# Patient Record
Sex: Male | Born: 2000 | Hispanic: Yes | Marital: Single | Smoking: Current every day smoker
Health system: Southern US, Community
[De-identification: ages and names within clinical notes are randomized; demographics above are authoritative.]

## PROBLEM LIST (undated history)

## (undated) HISTORY — PX: NO PAST SURGERIES: SHX2092

---
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
## 2019-03-09 ENCOUNTER — Emergency Department
Admission: EM | Admit: 2019-03-09 | Discharge: 2019-03-10 | Disposition: A | Discharge status: Home / Self Care | Payer: Self-pay | Attending: Emergency Medicine | Admitting: Emergency Medicine

## 2019-03-09 ENCOUNTER — Emergency Department: Payer: Self-pay

## 2019-03-09 ENCOUNTER — Other Ambulatory Visit: Payer: Self-pay

## 2019-03-09 ENCOUNTER — Encounter: Payer: Self-pay | Admitting: Emergency Medicine

## 2019-03-09 DIAGNOSIS — F1210 Cannabis abuse, uncomplicated: Secondary | ICD-10-CM | POA: Insufficient documentation

## 2019-03-09 DIAGNOSIS — I861 Scrotal varices: Secondary | ICD-10-CM | POA: Insufficient documentation

## 2019-03-09 DIAGNOSIS — N50812 Left testicular pain: Secondary | ICD-10-CM

## 2019-03-09 LAB — URINALYSIS, COMPLETE (UACMP) WITH MICROSCOPIC
Bacteria, UA: NONE SEEN
Bilirubin Urine: NEGATIVE
Glucose, UA: NEGATIVE mg/dL
Hgb urine dipstick: NEGATIVE
Ketones, ur: NEGATIVE mg/dL
Leukocytes,Ua: NEGATIVE
Nitrite: NEGATIVE
Protein, ur: NEGATIVE mg/dL
Specific Gravity, Urine: 1.024 (ref 1.005–1.030)
Squamous Epithelial / LPF: NONE SEEN (ref 0–5)
pH: 6 (ref 5.0–8.0)

## 2019-03-09 MED ORDER — IBUPROFEN 400 MG PO TABS
400.0000 mg | ORAL_TABLET | Freq: Once | ORAL | Status: AC
  Administered 2019-03-09: 400 mg via ORAL
  Filled 2019-03-09: qty 1

## 2019-03-09 NOTE — ED Triage Notes (Signed)
PT c/o LFT testicular pain since yesterday, worsening pain today, Denies any acute injury or swelling. NAD noted

## 2019-03-09 NOTE — ED Notes (Signed)
ED Provider at bedside. 

## 2019-03-09 NOTE — ED Provider Notes (Signed)
[name] Emergency Department Provider Note    First MD Initiated Contact with Patient 03/09/19 2327     (approximate)  I have reviewed the triage vital signs and the nursing notes.   HISTORY  Chief Complaint No chief complaint on file.    HPI Phillip Cole is a 18 y.o. male with medical history as listed below presents to the emergency department with acute onset of left testicular pain today.  Patient states that he lifted multiple heavy objects today at work.  Patient denies any abdominal discomfort.  Patient denies any dysuria no penile discharge.  Patient denies any fever.  Patient denies any nausea or vomiting.       History reviewed. No pertinent past medical history.  There are no active problems to display for this patient.   History reviewed. No pertinent surgical history.  Prior to Admission medications   Medication Sig Start Date End Date Taking? Authorizing Provider  meloxicam (MOBIC) 7.5 MG tablet Take 1 tablet (7.5 mg total) by mouth daily. For about one week and then as needed 09/10/15   Hassan Rowan, MD    Allergies Patient has no known allergies.  No family history on file.  Social History Social History   Tobacco Use  . Smoking status: Never Smoker  . Smokeless tobacco: Never Used  Substance Use Topics  . Alcohol use: No  . Drug use: Yes    Types: Marijuana    Review of Systems Constitutional: No fever/chills Eyes: No visual changes. ENT: No sore throat. Cardiovascular: Denies chest pain. Respiratory: Denies shortness of breath. Gastrointestinal: No abdominal pain.  No nausea, no vomiting.  No diarrhea.  No constipation. Genitourinary: Negative for dysuria.  Positive for left testicular pain Musculoskeletal: Negative for neck pain.  Negative for back pain. Integumentary: Negative for rash. Neurological: Negative for headaches, focal weakness or numbness.   ____________________________________________   PHYSICAL EXAM:  VITAL SIGNS: ED Triage Vitals [03/09/19 2226]  Enc Vitals Group     BP 140/74     Pulse Rate 70     Resp 16     Temp (!) 97.4 F (36.3 C)     Temp Source Oral     SpO2 100 %     Weight 64.4 kg (142 lb)     Height 1.778 m (5\' 10" )     Head Circumference      Peak Flow      Pain Score 9     Pain Loc      Pain Edu?      Excl. in GC?     Constitutional: Alert and oriented. Well appearing and in no acute distress. Eyes: Conjunctivae are normal. PERRL. EOMI. Mouth/Throat: Mucous membranes are moist.  Oropharynx non-erythematous. Neck: No stridor.   Cardiovascular: Normal rate, regular rhythm. Good peripheral circulation. Grossly normal heart sounds. Respiratory: Normal respiratory effort.  No retractions. Lungs CTAB. Gastrointestinal: Soft and nontender. No distention.  Genitourinary: Left testicular tenderness to palpation. Musculoskeletal: No lower extremity tenderness nor edema. No gross deformities of extremities. Neurologic:  Normal speech and language. No gross focal neurologic deficits are appreciated.  Skin:  Skin is warm, dry and intact. No rash noted. Psychiatric: Mood and affect are normal. Speech and behavior are normal.  ____________________________________________   LABS (all labs ordered are listed, but only abnormal results are displayed)  Labs Reviewed  URINALYSIS, COMPLETE (UACMP) WITH MICROSCOPIC - Abnormal; Notable for the following components:      Result Value  Color, Urine YELLOW (*)    APPearance CLEAR (*)    All other components within normal limits      Procedures   ____________________________________________   INITIAL IMPRESSION / MDM / ASSESSMENT AND PLAN / ED COURSE  As part of my medical decision making, I reviewed the following data within the electronic MEDICAL RECORD NUMBER   18 year old male presenting with above-stated history and physical exam secondary to left  testicular pain.  Scrotal ultrasound revealed varicocele.  Patient denied any dysuria no penile discharge however given known history of STDs in the past urine chlamydia gonorrhea was obtained however patient left emergency department before results were obtained.  I followed up on the patient's Chlamydia gonorrhea which revealed positive chlamydia.  I attempted to call the patient however he did not answer the phone.  I will leave note for  Phillip Cole to follow-up with the patient today.  ____________________________________________  FINAL CLINICAL IMPRESSION(S) / ED DIAGNOSES  Final diagnoses:  Varicocele     MEDICATIONS GIVEN DURING THIS VISIT:  Medications - No data to display   ED Discharge Orders    None       Note:  This document was prepared using Dragon voice recognition software and may include unintentional dictation errors.   [name], MD 03/10/19 [phone]

## 2019-03-09 NOTE — ED Notes (Signed)
Patient transported to Ultrasound 

## 2019-03-09 NOTE — ED Notes (Addendum)
Patient c/o left testicular pain beginning yesterday with worsening severity today. Patient denies swelling.   Patient reports recently being treated for chlamydia (2/19). Patient reports being treated for gonorrhea and chlamydia prior. Patient sexual intercourse since completing his antibiotics - no condom/protection was used.

## 2019-03-10 ENCOUNTER — Telehealth: Payer: Self-pay | Admitting: Emergency Medicine

## 2019-03-10 LAB — CHLAMYDIA/NGC RT PCR (ARMC ONLY)
Chlamydia Tr: DETECTED — AB
N gonorrhoeae: NOT DETECTED

## 2019-03-10 NOTE — ED Notes (Signed)
Reviewed discharge instructions, follow-up care, and OTC pain relievers with patient. Patient verbalized understanding of all information reviewed. Patient stable, with no distress noted at this time.    

## 2019-03-10 NOTE — Telephone Encounter (Signed)
Called patient due to positive chlamydia test.   Per dr Lenard Lance, call in azithromycin 1 gram po.  He was treated for same about 2 weeks ago--[facility]  I called [facility] and spoke with his provider there.   She agreed that we should treat again.  I called the patient to inform him.    I told him that his provider at clinic wanted him to be treated again for the chlamydia and he needs to call if he still has any symptoms on Monday. He says he has had unprotected sex since being treated.  I told him that she should also consult with health department to see if she needs to be treated. (this is a different partner than previous.)

## 2019-03-23 ENCOUNTER — Ambulatory Visit
Admission: EM | Admit: 2019-03-23 | Discharge: 2019-03-23 | Disposition: A | Discharge status: Home / Self Care | Payer: Self-pay | Attending: Family Medicine | Admitting: Family Medicine

## 2019-03-23 ENCOUNTER — Other Ambulatory Visit: Payer: Self-pay

## 2019-03-23 DIAGNOSIS — J029 Acute pharyngitis, unspecified: Secondary | ICD-10-CM

## 2019-03-23 DIAGNOSIS — R509 Fever, unspecified: Secondary | ICD-10-CM

## 2019-03-23 DIAGNOSIS — R05 Cough: Secondary | ICD-10-CM

## 2019-03-23 LAB — RAPID STREP SCREEN (MED CTR MEBANE ONLY): Streptococcus, Group A Screen (Direct): NEGATIVE

## 2019-03-23 MED ORDER — NAPROXEN 500 MG PO TABS: 500.0000 mg | ORAL_TABLET | Freq: Two times a day (BID) | ORAL | 0 refills | Status: AC | PRN

## 2019-03-23 MED ORDER — IPRATROPIUM BROMIDE 0.06 % NA SOLN: 2.0000 | Freq: Four times a day (QID) | NASAL | 0 refills | Status: AC | PRN

## 2019-03-23 NOTE — ED Provider Notes (Signed)
[facility]    CSN: [id number] Arrival date & time: 03/23/19  1024  History   Chief Complaint Chief Complaint  Patient presents with  . Cough   HPI  18 year old male presents with multiple complaints.  Reports that he has not been feeling well since last Thursday.  Patient reports subjective fever.  No documented fever.  He reports body aches.  He states that he has had a slight cough.  He also reports sore throat.  He currently has no fever and no body aches.  He states that he coughed briefly this morning.  His primary complaint is sore throat.  He also states that he has had blood when blowing his nose.  He states that he is taken over-the-counter medication without resolution.  No known exacerbating factors.  No other associated symptoms.  No other complaints.  History reviewed and updated as below.  PMH: Hx of STD  Past Surgical History:  Procedure Laterality Date  . NO PAST SURGERIES     Home Medications    Prior to Admission medications   Medication Sig Start Date End Date Taking? Authorizing Provider  ipratropium (ATROVENT) 0.06 % nasal spray Place 2 sprays into both nostrils 4 (four) times daily as needed for rhinitis. 03/23/19   Tommie Sams, DO  naproxen (NAPROSYN) 500 MG tablet Take 1 tablet (500 mg total) by mouth 2 (two) times daily as needed for moderate pain. 03/23/19   Tommie Sams, DO   Social History Social History   Tobacco Use  . Smoking status: Never Smoker  . Smokeless tobacco: Never Used  Substance Use Topics  . Alcohol use: No  . Drug use: Yes    Types: Marijuana     Allergies   Patient has no known allergies.   Review of Systems Review of Systems  Constitutional: Positive for fever.  HENT: Positive for sore throat.   Respiratory: Positive for cough.   Musculoskeletal:       Body aches.   Physical Exam Triage Vital Signs ED Triage Vitals  Enc Vitals Group     BP 03/23/19 1043 109/70     Pulse Rate 03/23/19 1043 (!) 54      Resp 03/23/19 1043 16     Temp 03/23/19 1043 97.7 F (36.5 C)     Temp Source 03/23/19 1043 Oral     SpO2 03/23/19 1043 100 %     Weight 03/23/19 1041 142 lb (64.4 kg)     Height 03/23/19 1041 5\' 10"  (1.778 m)     Head Circumference --      Peak Flow --      Pain Score 03/23/19 1041 0     Pain Loc --      Pain Edu? --      Excl. in GC? --    Updated Vital Signs BP 109/70 (BP Location: Left Arm)   Pulse (!) 54   Temp 97.7 F (36.5 C) (Oral)   Resp 16   Ht 5\' 10"  (1.778 m)   Wt 64.4 kg   SpO2 100%   BMI 20.37 kg/m    Visual Acuity Right Eye Distance:   Left Eye Distance:   Bilateral Distance:    Right Eye Near:   Left Eye Near:    Bilateral Near:     Physical Exam Constitutional:      General: He is not in acute distress.    Appearance: Normal appearance. He is normal weight.  HENT:  Head: Normocephalic and atraumatic.     Right Ear: Tympanic membrane normal.     Left Ear: Tympanic membrane normal.     Mouth/Throat:     Pharynx: Oropharynx is clear. No oropharyngeal exudate.  Eyes:     General:        Right eye: No discharge.        Left eye: No discharge.     Conjunctiva/sclera: Conjunctivae normal.  Cardiovascular:     Rate and Rhythm: Regular rhythm. Bradycardia present.  Pulmonary:     Effort: Pulmonary effort is normal.     Breath sounds: Normal breath sounds. No wheezing or rales.  Neurological:     Mental Status: He is alert.  Psychiatric:        Mood and Affect: Mood normal.        Behavior: Behavior normal.    UC Treatments / Results  Labs (all labs ordered are listed, but only abnormal results are displayed) Labs Reviewed  RAPID STREP SCREEN ([facility] ONLY)  CULTURE, GROUP A STREP [facility])    EKG None  Radiology No results found.  Procedures Procedures (including critical care time)  Medications Ordered in UC Medications - No data to display  Initial Impression / Assessment and Plan / UC Course  I have reviewed the  triage vital signs and the nursing notes.  Pertinent labs & imaging results that were available during my care of the patient were reviewed by me and considered in my medical decision making (see chart for details).    18 year old male presents with suspected viral illness/viral pharyngitis.  He is well-appearing.  No evidence of influenza.  No evidence of COVID-19.  Vital signs stable.  Strep negative. Treating with Atrovent nasal spray and naproxen.  Supportive care.  Final Clinical Impressions(s) / UC Diagnoses   Final diagnoses:  Viral pharyngitis     Discharge Instructions     No evidence of flu.  Strep negative.  Medication as prescribed.  Take care  Dr. Adriana Simasook    ED Prescriptions    Medication Sig Dispense Auth. Provider   ipratropium (ATROVENT) 0.06 % nasal spray Place 2 sprays into both nostrils 4 (four) times daily as needed for rhinitis. 15 mL Drew Lips G, DO   naproxen (NAPROSYN) 500 MG tablet Take 1 tablet (500 mg total) by mouth 2 (two) times daily as needed for moderate pain. 30 tablet Tommie Samsook, Elmo Shumard G, DO     Controlled Substance Prescriptions [place] Controlled Substance Registry consulted? Not Applicable  Tommie SamsCook, Chane Magner G, DO 03/23/19 1111

## 2019-03-23 NOTE — ED Triage Notes (Signed)
Patient complains of fever, bodyaches and cough that started on Thursday. Patient states that his throat also hurts.

## 2019-03-23 NOTE — Discharge Instructions (Addendum)
No evidence of flu.  Strep negative.  Medication as prescribed.  Take care  Dr. Adriana Simas

## 2019-03-26 LAB — CULTURE, GROUP A STREP (THRC)

## 2019-12-09 IMAGING — US ULTRASOUND SCROTUM DOPPLER COMPLETE
1 series · 14 of 25 positions shown · non-contrast
Comparison: None.

CLINICAL DATA: LEFT testicular pain since yesterday.

EXAM:
SCROTAL ULTRASOUND
DOPPLER ULTRASOUND OF THE TESTICLES
TECHNIQUE: Complete ultrasound examination of the testicles, epididymis, and
other scrotal structures was performed. Color and spectral Doppler
ultrasound were also utilized to evaluate blood flow to the
testicles.

[Series 1: ultrasound scrotum doppler complete · 0.08mm/px · 14 of 58 slices shown]
[im 1/58]
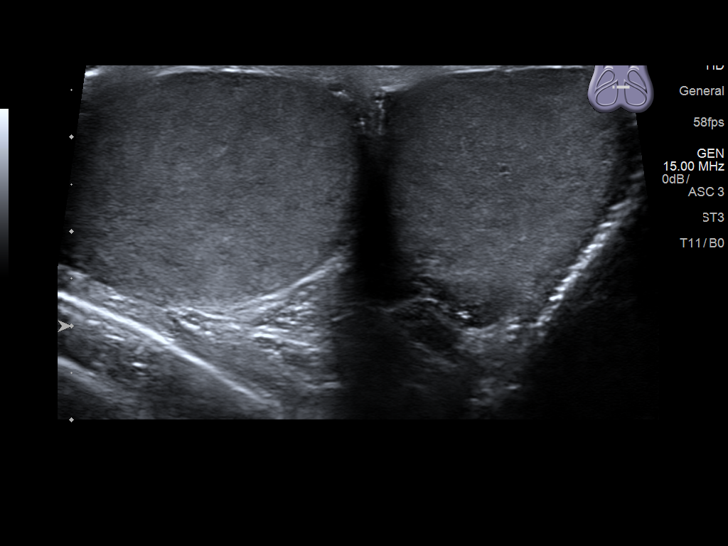
[im 5/58]
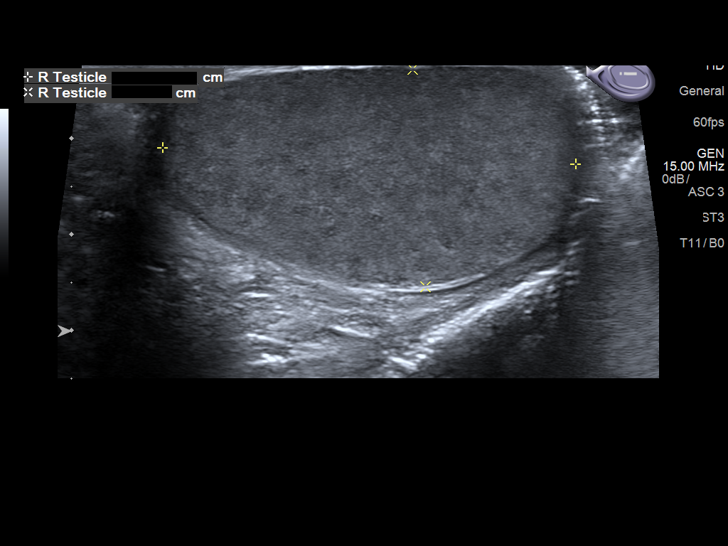
[im 10/58]
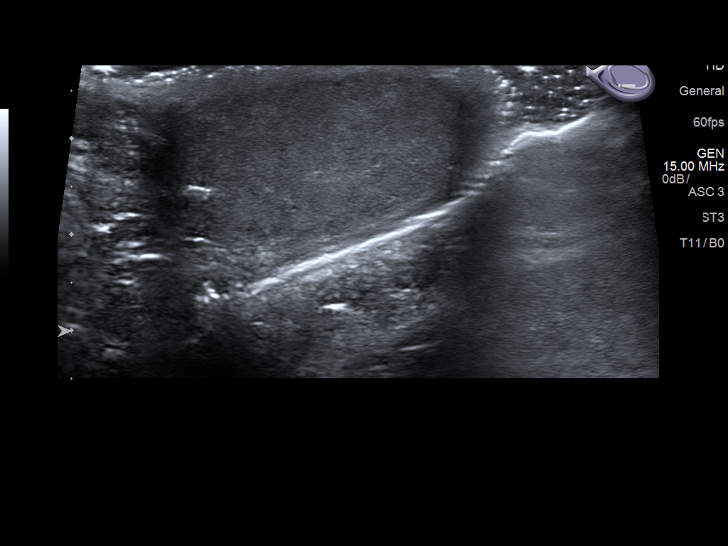
[im 15/58]
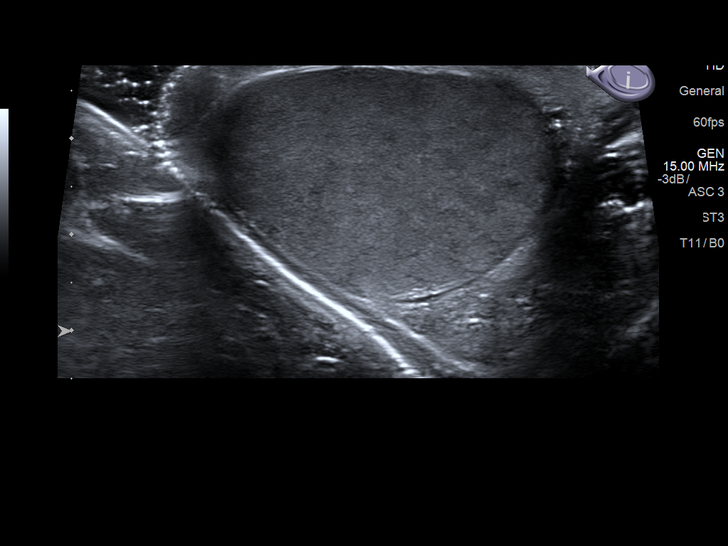
[im 20/58]
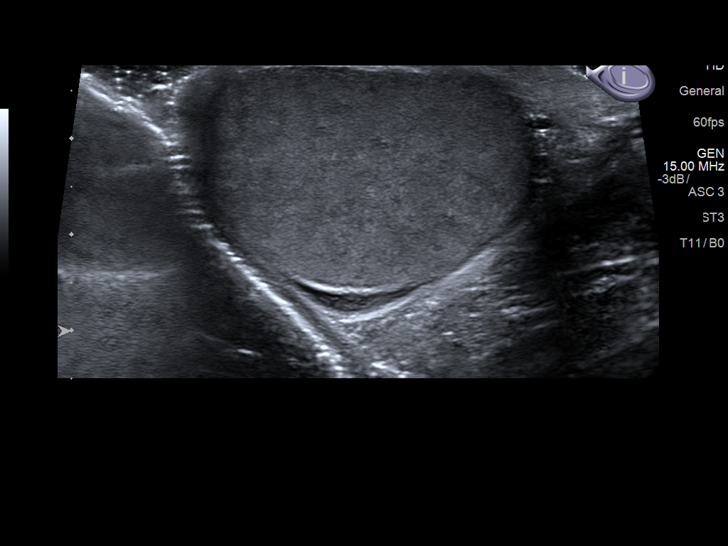
[im 22/58]
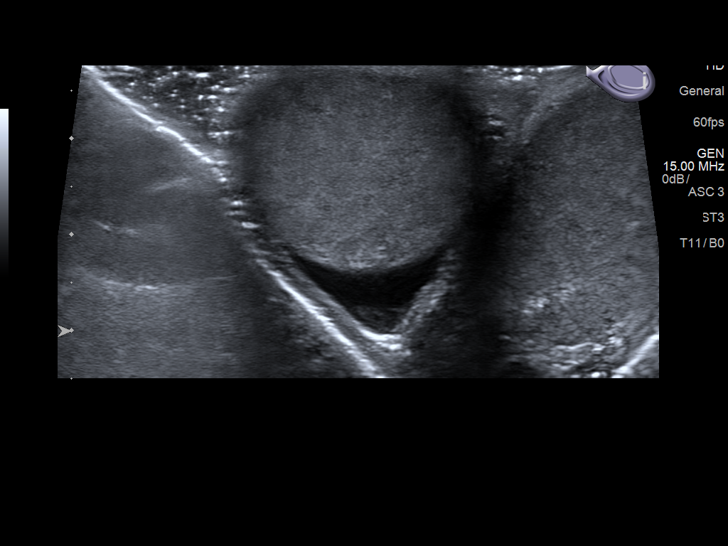
[im 27/58]
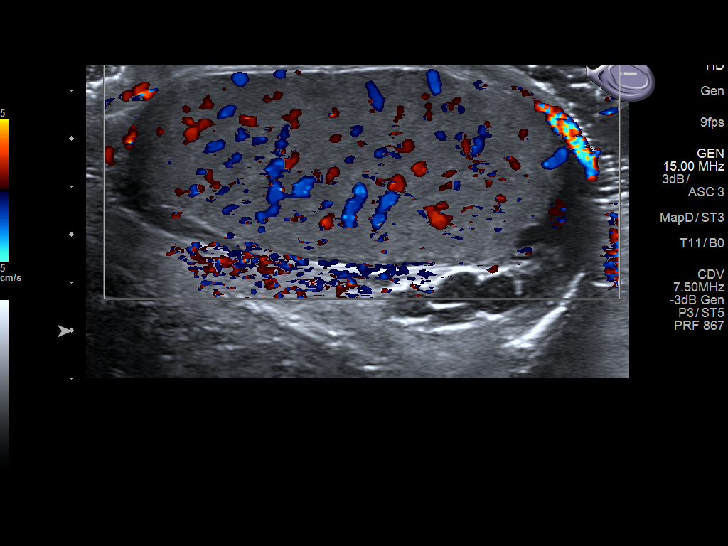
[im 31/58]
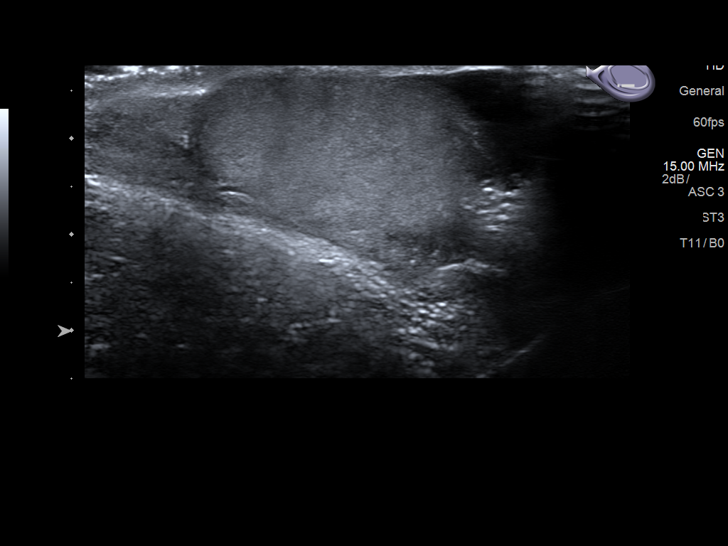
[im 36/58]
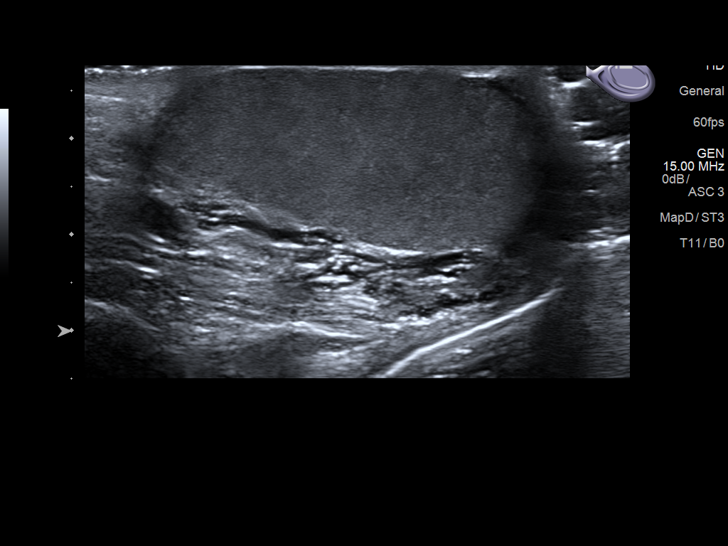
[im 39/58]
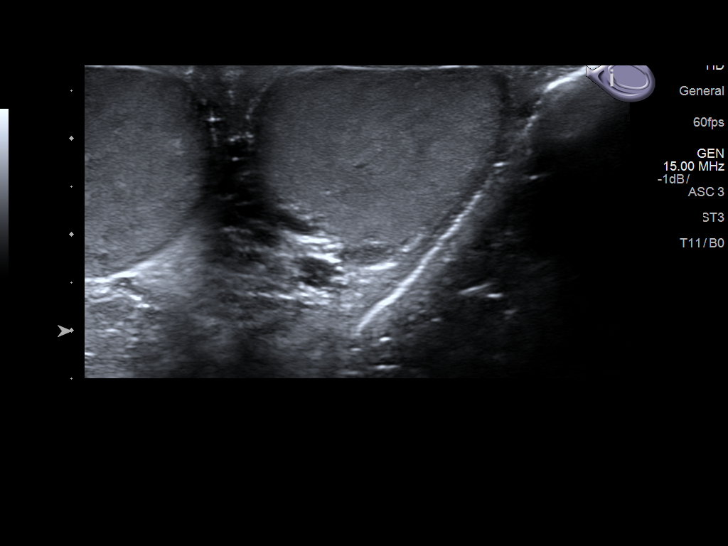
[im 43/58]
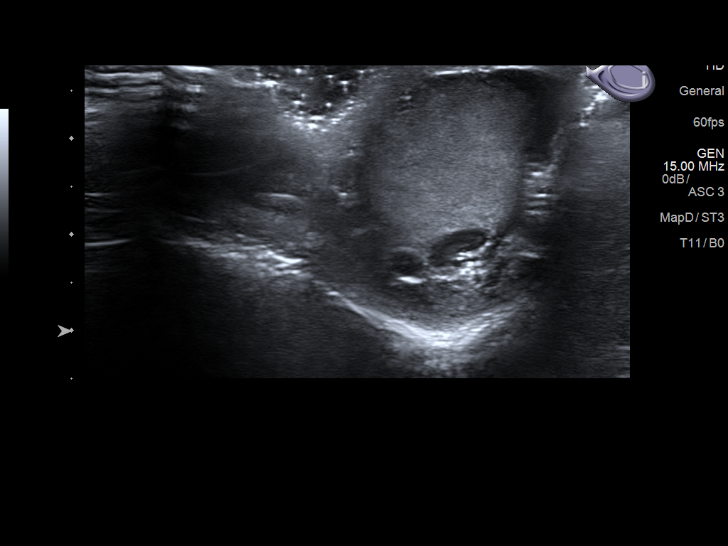
[im 48/58]
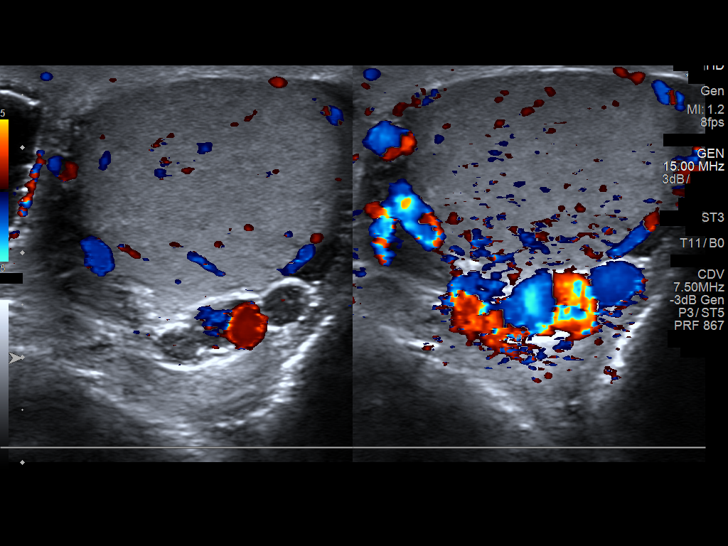
[im 53/58]
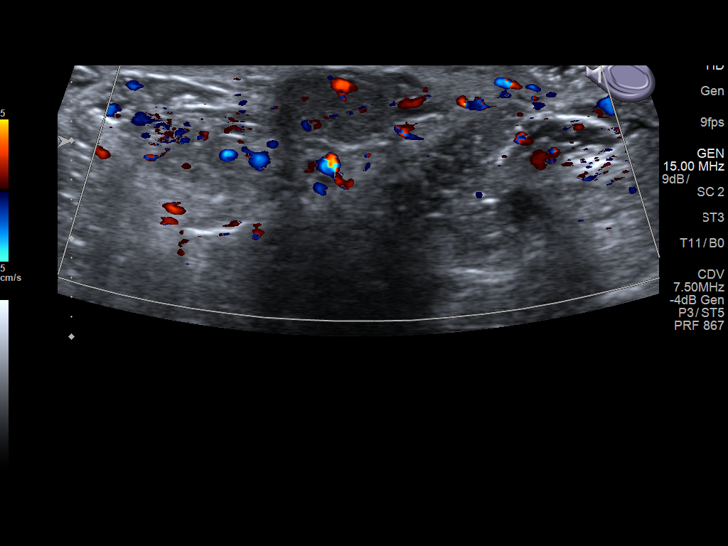
[im 58/58]
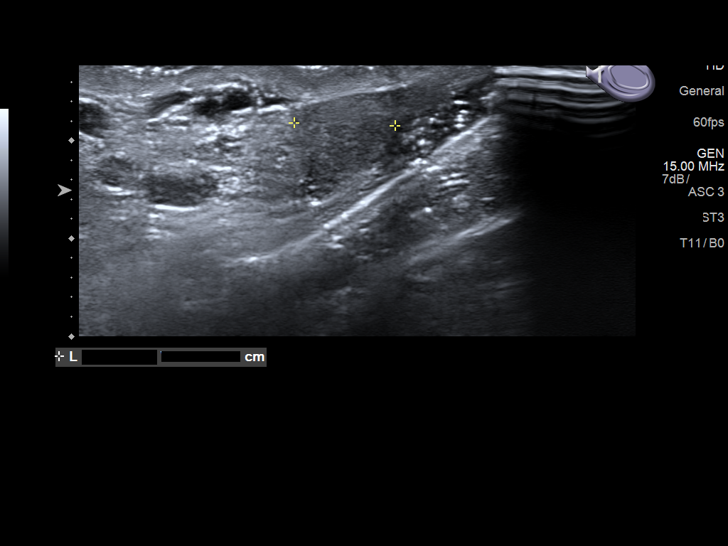

[14 of 25 positions shown; findings below may reference images not displayed]

FINDINGS: Right testicle

Measurements: 4.3 x 2.3 x 3.4 cm. No mass or microlithiasis
visualized.

Left testicle

Measurements: 4.3 x 2.2 x 2.5 cm. No mass or microlithiasis
visualized.

Right epididymis:  Normal in size and appearance.

Left epididymis:  Normal in size and appearance.

Hydrocele:  None visualized.

Varicocele:  Mild LEFT varicocele.

Pulsed Doppler interrogation of both testes demonstrates normal low
resistance arterial and venous waveforms bilaterally.
IMPRESSION: 1. No acute scrotal process.  LEFT varicocele.

## 2021-08-30 ENCOUNTER — Emergency Department: Payer: Self-pay

## 2021-08-30 ENCOUNTER — Emergency Department
Admission: EM | Admit: 2021-08-30 | Discharge: 2021-08-31 | Disposition: A | Discharge status: Home / Self Care | Payer: Self-pay | Attending: Emergency Medicine | Admitting: Emergency Medicine

## 2021-08-30 ENCOUNTER — Other Ambulatory Visit: Payer: Self-pay

## 2021-08-30 ENCOUNTER — Encounter: Payer: Self-pay | Admitting: *Deleted

## 2021-08-30 DIAGNOSIS — R079 Chest pain, unspecified: Secondary | ICD-10-CM

## 2021-08-30 DIAGNOSIS — K2900 Acute gastritis without bleeding: Secondary | ICD-10-CM | POA: Insufficient documentation

## 2021-08-30 DIAGNOSIS — F17210 Nicotine dependence, cigarettes, uncomplicated: Secondary | ICD-10-CM | POA: Insufficient documentation

## 2021-08-30 DIAGNOSIS — R0789 Other chest pain: Secondary | ICD-10-CM

## 2021-08-30 LAB — BASIC METABOLIC PANEL
Anion gap: 7 (ref 5–15)
BUN: 23 mg/dL — ABNORMAL HIGH (ref 6–20)
CO2: 28 mmol/L (ref 22–32)
Calcium: 9.2 mg/dL (ref 8.9–10.3)
Chloride: 100 mmol/L (ref 98–111)
Creatinine, Ser: 0.92 mg/dL (ref 0.61–1.24)
GFR, Estimated: >60 mL/min (ref >60)
Glucose, Bld: 97 mg/dL (ref 70–99)
Potassium: 3.6 mmol/L (ref 3.5–5.1)
Sodium: 135 mmol/L (ref 135–145)

## 2021-08-30 LAB — CBC
HCT: 41.9 % (ref 39.0–52.0)
Hemoglobin: 15 g/dL (ref 13.0–17.0)
MCH: 31.1 pg (ref 26.0–34.0)
MCHC: 35.8 g/dL (ref 30.0–36.0)
MCV: 86.7 fL (ref 80.0–100.0)
Platelets: 269 10*3/uL (ref 150–400)
RBC: 4.83 MIL/uL (ref 4.22–5.81)
RDW: 11.9 % (ref 11.5–15.5)
WBC: 9.7 10*3/uL (ref 4.0–10.5)
nRBC: 0 % (ref 0.0–0.2)

## 2021-08-30 LAB — TROPONIN I (HIGH SENSITIVITY)
Troponin I (High Sensitivity): <2 ng/L (ref <18)
Troponin I (High Sensitivity): <2 ng/L (ref <18)

## 2021-08-30 LAB — D-DIMER, QUANTITATIVE: D-Dimer, Quant: 0.28 ug/mL-FEU (ref 0.00–0.50)

## 2021-08-30 MED ORDER — KETOROLAC TROMETHAMINE 60 MG/2ML IM SOLN
30.0000 mg | Freq: Once | INTRAMUSCULAR | Status: AC
  Administered 2021-08-30: 30 mg via INTRAMUSCULAR
  Filled 2021-08-30: qty 2

## 2021-08-30 MED ORDER — ALUM & MAG HYDROXIDE-SIMETH 200-200-20 MG/5ML PO SUSP
30.0000 mL | Freq: Once | ORAL | Status: AC
  Administered 2021-08-30: 30 mL via ORAL
  Filled 2021-08-30: qty 30

## 2021-08-30 MED ORDER — LIDOCAINE VISCOUS HCL 2 % MT SOLN
15.0000 mL | Freq: Once | OROMUCOSAL | Status: AC
  Administered 2021-08-30: 15 mL via ORAL
  Filled 2021-08-30: qty 15

## 2021-08-30 NOTE — ED Provider Notes (Signed)
[facility] Emergency Department Provider Note   ____________________________________________   Event Date/Time   First MD Initiated Contact with Patient 08/30/21 2334     (approximate)  I have reviewed the triage vital signs and the nursing notes.   HISTORY  Chief Complaint Chest Pain    HPI Phillip Cole is a 20 y.o. male who presents to the ED from home with a chief complaint of right chest pain x1 day.  Worse to take a deep breath and with movements.  Has been experiencing heartburn from eating spicy foods.  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  Denies recent fever, cough, shortness of breath, abdominal pain, nausea, vomiting or dizziness.  Denies recent travel, trauma or hormone use.     Past medical history None  There are no problems to display for this patient.   History reviewed. No pertinent surgical history.  Prior to Admission medications   Medication Sig Start Date End Date Taking? Authorizing Provider  famotidine (PEPCID) 20 MG tablet Take 1 tablet (20 mg total) by mouth 2 (two) times daily. 08/31/21  Yes Irean Hong, MD  naproxen (NAPROSYN) 500 MG tablet Take 1 tablet (500 mg total) by mouth 2 (two) times daily with a meal. 08/31/21  Yes Irean Hong, MD    Allergies Patient has no known allergies.  No family history on file.  Social History Social History   Tobacco Use   Smoking status: Every Day    Types: Cigarettes  Substance Use Topics   Drug use: Yes    Types: Marijuana    Review of Systems  Constitutional: No fever/chills Eyes: No visual changes. ENT: No sore throat. Cardiovascular: Positive for chest pain. Respiratory: Denies shortness of breath. Gastrointestinal: No abdominal pain.  No nausea, no vomiting.  No diarrhea.  No constipation. Genitourinary: Negative for dysuria. Musculoskeletal: Negative for back pain. Skin: Negative for rash. Neurological:  Negative for headaches, focal weakness or numbness.   ____________________________________________   PHYSICAL EXAM:  VITAL SIGNS: ED Triage Vitals  Enc Vitals Group     BP 08/30/21 1935 (!) 121/57     Pulse Rate 08/30/21 1935 77     Resp 08/30/21 1935 18     Temp 08/30/21 1935 98.9 F (37.2 C)     Temp Source 08/30/21 1935 Oral     SpO2 08/30/21 1935 99 %     Weight --      Height --      Head Circumference --      Peak Flow --      Pain Score 08/30/21 1940 7     Pain Loc --      Pain Edu? --      Excl. in GC? --     Constitutional: Alert and oriented. Well appearing and in no acute distress. Eyes: Conjunctivae are normal. PERRL. EOMI. Head: Atraumatic. Nose: No congestion/rhinnorhea. Mouth/Throat: Mucous membranes are moist.   Neck: No stridor.   Cardiovascular: Normal rate, regular rhythm. Grossly normal heart sounds.  Good peripheral circulation. Respiratory: Normal respiratory effort.  No retractions. Lungs CTAB.  Right lateral chest tender to palpation. Gastrointestinal: Soft and nontender to light or deep palpation. No distention. No abdominal bruits. No CVA tenderness. Musculoskeletal: No lower extremity tenderness nor edema.  No joint effusions. Neurologic:  Normal speech and language. No gross focal neurologic deficits are appreciated. No gait instability. Skin:  Skin is warm, dry and intact. No rash  noted. Psychiatric: Mood and affect are normal. Speech and behavior are normal.  ____________________________________________   LABS (all labs ordered are listed, but only abnormal results are displayed)  Labs Reviewed  BASIC METABOLIC PANEL - Abnormal; Notable for the following components:      Result Value   BUN 23 (*)    All other components within normal limits  CBC  D-DIMER, QUANTITATIVE  HEPATIC FUNCTION PANEL  LIPASE, BLOOD  TROPONIN I (HIGH SENSITIVITY)  TROPONIN I (HIGH SENSITIVITY)   ____________________________________________  EKG  ED  ECG REPORT I, Xenia Nile J, the attending physician, personally viewed and interpreted this ECG.   Date: 08/30/2021  EKG Time: 1933  Rate: 62  Rhythm: normal EKG, normal sinus rhythm  Axis: Normal  Intervals:none  ST&T Change: Nonspecific  ____________________________________________  RADIOLOGY I, Ferris Tally J, personally viewed and evaluated these images (plain radiographs) as part of my medical decision making, as well as reviewing the written report by the radiologist.  ED MD interpretation: No acute cardiopulmonary process  Official radiology report(s): DG Chest 2 View  Result Date: 08/30/2021 CLINICAL DATA:  Chest pain EXAM: CHEST - 2 VIEW COMPARISON:  None. FINDINGS: The heart size and mediastinal contours are within normal limits. Both lungs are clear. The visualized skeletal structures are unremarkable. IMPRESSION: No active cardiopulmonary disease. Electronically Signed   By: Jasmine Pang M.D.   On: 08/30/2021 20:00    ____________________________________________   PROCEDURES  Procedure(s) performed (including Critical Care):  Procedures   ____________________________________________   INITIAL IMPRESSION / ASSESSMENT AND PLAN / ED COURSE  As part of my medical decision making, I reviewed the following data within the electronic MEDICAL RECORD NUMBER History obtained from family, Nursing notes reviewed and incorporated, Labs reviewed, EKG interpreted, Old chart reviewed, Radiograph reviewed, and Notes from prior ED visits     20 year old male presenting with chest pain and heartburn. Differential diagnosis includes, but is not limited to, ACS, aortic dissection, pulmonary embolism, cardiac tamponade, pneumothorax, pneumonia, pericarditis, myocarditis, GI-related causes including esophagitis/gastritis, and musculoskeletal chest wall pain.     Laboratory results including 2 sets of troponin, D-dimer, chest x-ray and EKG unremarkable.  Will add LFTs/lipase.  Administer GI  cocktail, IM Toradol.  Will reassess.  Clinical Course as of 08/31/21 0256  Wynelle Link Aug 31, 2021  0045 Patient feeling better and eager for discharge home.  Strict return precautions given.  Patient and family member verbalized understanding and agree with plan of care. [JS]    Clinical Course User Index [JS] Irean Hong, MD     ____________________________________________   FINAL CLINICAL IMPRESSION(S) / ED DIAGNOSES  Final diagnoses:  Acute gastritis without hemorrhage, unspecified gastritis type  Nonspecific chest pain  Chest wall pain     ED Discharge Orders          Ordered    famotidine (PEPCID) 20 MG tablet  2 times daily        08/31/21 0046    naproxen (NAPROSYN) 500 MG tablet  2 times daily with meals        08/31/21 0046             Note:  This document was prepared using Dragon voice recognition software and may include unintentional dictation errors.    Irean Hong, MD 08/31/21 [phone]

## 2021-08-30 NOTE — ED Triage Notes (Signed)
Pt with onset of pain in the right chest and back pain since yesterday, worse to take a deep breath. NAD in triage, ambulatory with steady gait.

## 2021-08-31 LAB — HEPATIC FUNCTION PANEL
ALT: 16 U/L (ref 0–44)
AST: 20 U/L (ref 15–41)
Albumin: 4.6 g/dL (ref 3.5–5.0)
Alkaline Phosphatase: 58 U/L (ref 38–126)
Bilirubin, Direct: <0.1 mg/dL (ref 0.0–0.2)
Total Bilirubin: 0.7 mg/dL (ref 0.3–1.2)
Total Protein: 7.2 g/dL (ref 6.5–8.1)

## 2021-08-31 LAB — LIPASE, BLOOD: Lipase: 29 U/L (ref 11–51)

## 2021-08-31 MED ORDER — NAPROXEN 500 MG PO TABS: 500.0000 mg | ORAL_TABLET | Freq: Two times a day (BID) | ORAL | 0 refills | Status: AC

## 2021-08-31 MED ORDER — FAMOTIDINE 20 MG PO TABS: 20.0000 mg | ORAL_TABLET | Freq: Two times a day (BID) | ORAL | 0 refills | Status: AC

## 2021-08-31 NOTE — Discharge Instructions (Addendum)
1.  Start Pepcid 20 mg twice daily for heartburn symptoms. 2.  You may take Naprosyn twice daily as needed for chest discomfort. 3.  Apply moist heat to affected chest area several times daily. 4.  Return to the ER for worsening symptoms, persistent vomiting, difficulty breathing or other concerns

## 2021-08-31 NOTE — ED Notes (Signed)
Signature pad not available at time of d/c. Pt verbalized understanding of d/c instructions, ambulatory to POV for d/c

## 2022-12-08 ENCOUNTER — Ambulatory Visit: Payer: Self-pay

## 2022-12-08 NOTE — Telephone Encounter (Signed)
  Chief Complaint: back pain Symptoms: back pain from neck to lower back but mostly pain in lower section, 8/10 Frequency: 3 months Pertinent Negatives: Patient denies dizziness Disposition: [] ED /[x] Urgent Care (no appt availability in office) / [] Appointment(In office/virtual)/ []  [facility]/ [] Home Care/ [] Refused Recommended Disposition /[] [facility]/ []  Follow-up with PCP Additional Notes: no PCP, advised UC, pt going to do walk in at [facility]   Reason for Disposition  [1] MODERATE back pain (e.g., interferes with normal activities) AND [2] present > 3 days  Answer Assessment - Initial Assessment Questions 1. ONSET: "When did the pain begin?"      3 months  2. LOCATION: "Where does it hurt?" (upper, mid or lower back)     From neck to buttocks  3. SEVERITY: "How bad is the pain?"  (e.g., Scale 1-10; mild, moderate, or severe)   - MILD (1-3): Doesn't interfere with normal activities.    - MODERATE (4-7): Interferes with normal activities or awakens from sleep.    - SEVERE (8-10): Excruciating pain, unable to do any normal activities.      8/10 4. PATTERN: "Is the pain constant?" (e.g., yes, no; constant, intermittent)      Comes and goes  8. MEDICINES: "What have you taken so far for the pain?" (e.g., nothing, acetaminophen, NSAIDS)     no 10. OTHER SYMPTOMS: "Do you have any other symptoms?" (e.g., fever, abdomen pain, burning with urination, blood in urine)  Protocols used: Back Pain-A-AH
